# Patient Record
Sex: Male | Born: 1995 | Race: Black or African American | Hispanic: No | Marital: Single | Smoking: Never smoker
Health system: Southern US, Community
[De-identification: ages and names within clinical notes are randomized; demographics above are authoritative.]

## PROBLEM LIST (undated history)

## (undated) DIAGNOSIS — R569 Unspecified convulsions: Secondary | ICD-10-CM

## (undated) DIAGNOSIS — J45909 Unspecified asthma, uncomplicated: Secondary | ICD-10-CM

## (undated) DIAGNOSIS — I10 Essential (primary) hypertension: Secondary | ICD-10-CM

---
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
## 2018-02-13 ENCOUNTER — Encounter (HOSPITAL_COMMUNITY): Payer: Self-pay | Admitting: Emergency Medicine

## 2018-02-13 ENCOUNTER — Emergency Department (HOSPITAL_COMMUNITY): Payer: BLUE CROSS/BLUE SHIELD

## 2018-02-13 ENCOUNTER — Other Ambulatory Visit: Payer: Self-pay

## 2018-02-13 ENCOUNTER — Emergency Department (HOSPITAL_COMMUNITY)
Admission: EM | Admit: 2018-02-13 | Discharge: 2018-02-13 | Disposition: A | Discharge status: Home / Self Care | Payer: BLUE CROSS/BLUE SHIELD | Attending: Emergency Medicine | Admitting: Emergency Medicine

## 2018-02-13 DIAGNOSIS — W228XXA Striking against or struck by other objects, initial encounter: Secondary | ICD-10-CM | POA: Insufficient documentation

## 2018-02-13 DIAGNOSIS — Y929 Unspecified place or not applicable: Secondary | ICD-10-CM | POA: Insufficient documentation

## 2018-02-13 DIAGNOSIS — Z79899 Other long term (current) drug therapy: Secondary | ICD-10-CM | POA: Insufficient documentation

## 2018-02-13 DIAGNOSIS — I10 Essential (primary) hypertension: Secondary | ICD-10-CM | POA: Insufficient documentation

## 2018-02-13 DIAGNOSIS — J45909 Unspecified asthma, uncomplicated: Secondary | ICD-10-CM | POA: Insufficient documentation

## 2018-02-13 DIAGNOSIS — Y990 Civilian activity done for income or pay: Secondary | ICD-10-CM | POA: Insufficient documentation

## 2018-02-13 DIAGNOSIS — M25572 Pain in left ankle and joints of left foot: Secondary | ICD-10-CM | POA: Insufficient documentation

## 2018-02-13 DIAGNOSIS — Y939 Activity, unspecified: Secondary | ICD-10-CM | POA: Insufficient documentation

## 2018-02-13 MED ORDER — IBUPROFEN 400 MG PO TABS
600.0000 mg | ORAL_TABLET | Freq: Once | ORAL | Status: AC
  Administered 2018-02-13: 600 mg via ORAL
  Filled 2018-02-13: qty 1

## 2018-02-13 MED ORDER — IBUPROFEN 600 MG PO TABS: 600.0000 mg | ORAL_TABLET | Freq: Four times a day (QID) | ORAL | 0 refills | Status: AC | PRN

## 2018-02-13 NOTE — ED Triage Notes (Signed)
C/o L ankle pain since 9pm.  States he works at [employer] and kicked boxes that were jammed and foot got caught between boxes.

## 2018-02-13 NOTE — ED Provider Notes (Signed)
[facility] EMERGENCY DEPARTMENT Provider Note   CSN: [id number] Arrival date & time: 02/13/18  0024     History   Chief Complaint Chief Complaint  Patient presents with  . Ankle Pain    HPI Joshua Ingram is a 22 y.o. male a history of seizures, HTN, and asthma who presents to the emergency department with a chief complaint of left ankle pain.  The patient reports that he was at work when he jumped on a belt and his foot became stuck between a pole and a box at approximately 2145.  He states that it took approximately 10 minutes to dislodge his foot from the entrapment.  He endorses sudden onset anterior left ankle pain following the incident.  He characterizes the pain as throbbing, which is been constant since onset.  He has been ambulatory, but with some pain.  He denies numbness, weakness, ecchymosis, left foot or left knee pain.  No history of previous injury or surgery.  No treatment prior to arrival.  The history is provided by the patient. No language interpreter was used.    Past Medical History:  Diagnosis Date  . Asthma   . Hypertension   . Seizures (HCC)     There are no active problems to display for this patient.   History reviewed. No pertinent surgical history.      Home Medications    Prior to Admission medications   Medication Sig Start Date End Date Taking? Authorizing Provider  albuterol (PROVENTIL HFA;VENTOLIN HFA) 108 (90 Base) MCG/ACT inhaler Inhale 1-2 puffs into the lungs every 4 (four) hours as needed for wheezing or shortness of breath. 11/07/15   Sherrilee Gilles, NP  cetirizine (ZYRTEC) 10 MG tablet Take 10 mg by mouth daily.    [provider]  cyclobenzaprine (FLEXERIL) 5 MG tablet Take 1 tablet (5 mg total) by mouth 3 (three) times daily as needed for muscle spasms. 11/07/15   Sherrilee Gilles, NP  Fluticasone-Salmeterol (ADVAIR) 250-50 MCG/DOSE AEPB Inhale 1 puff into the lungs every 12 (twelve) hours.     [provider]  ibuprofen (ADVIL,MOTRIN) 600 MG tablet Take 1 tablet (600 mg total) by mouth every 6 (six) hours as needed (With Food). 02/13/18   Urban Naval A, PA-C  naproxen (NAPROSYN) 500 MG tablet Take 1 tablet (500 mg total) by mouth 2 (two) times daily with a meal. 04/11/16   Oxford, Anselm Pancoast, FNP  Olopatadine HCl (PATADAY) 0.2 % SOLN Place 1 drop into both eyes daily as needed (for allergies).    [provider]  Olopatadine HCl (PATANASE) 0.6 % SOLN Place 1 puff into the nose daily as needed (allergies).    [provider]    Family History No family history on file.  Social History Social History   Tobacco Use  . Smoking status: Never Smoker  . Smokeless tobacco: Never Used  Substance Use Topics  . Alcohol use: Not Currently  . Drug use: Not Currently     Allergies   Naproxen   Review of Systems Review of Systems  Constitutional: Negative for activity change.  Respiratory: Negative for shortness of breath.   Cardiovascular: Negative for chest pain.  Gastrointestinal: Negative for abdominal pain.  Musculoskeletal: Positive for arthralgias, gait problem and myalgias. Negative for back pain and joint swelling.  Skin: Negative for rash.  Neurological: Negative for weakness and numbness.     Physical Exam Updated Vital Signs BP (!) 160/89 (BP Location: Right Arm)  Pulse (!) 107   Temp 98.4 F (36.9 C) (Oral)   Resp 16   SpO2 100%   Physical Exam  Constitutional: He appears well-developed.  HENT:  Head: Normocephalic.  Eyes: Conjunctivae are normal.  Neck: Neck supple.  Cardiovascular: Normal rate and regular rhythm.  No murmur heard. Pulmonary/Chest: Effort normal.  Abdominal: Soft. He exhibits no distension.  Musculoskeletal: He exhibits tenderness. He exhibits no edema or deformity.  Full active and passive range of motion of the left ankle and knee.  He is mildly tender to palpation to the anterior aspect of the left  ankle.  No lateral or medial malleolus tenderness.  No tenderness to the left Achilles tendon.  DP and PT pulses are 2+ and symmetric.  5 out of 5 strength against resistance with dorsiflexion plantarflexion.  Able to bear weight on the bilateral lower extremities.  Ambulatory with minimal difficulty.  Neurological: He is alert.  Skin: Skin is warm and dry.  Psychiatric: His behavior is normal.  Nursing note and vitals reviewed.    ED Treatments / Results  Labs (all labs ordered are listed, but only abnormal results are displayed) Labs Reviewed - No data to display  EKG None  Radiology Dg Ankle Complete Left  Result Date: 02/13/2018 CLINICAL DATA:  22 y/o  M; left ankle pain after injury. EXAM: LEFT ANKLE COMPLETE - 3+ VIEW COMPARISON:  None. FINDINGS: There is no evidence of fracture, dislocation, or joint effusion. There is no evidence of arthropathy or other focal bone abnormality. IMPRESSION: Negative. Electronically Signed   By: Mitzi Hansen M.D.   On: 02/13/2018 01:08    Procedures Procedures (including critical care time)  Medications Ordered in ED Medications  ibuprofen (ADVIL,MOTRIN) tablet 600 mg (has no administration in time range)     Initial Impression / Assessment and Plan / ED Course  I have reviewed the triage vital signs and the nursing notes.  Pertinent labs & imaging results that were available during my care of the patient were reviewed by me and considered in my medical decision making (see chart for details).     Patient X-Ray negative for obvious fracture or dislocation. Pain managed in ED. Pt advised to follow up with primary care if symptoms persist. Patient given brace while in ED, conservative therapy recommended and discussed. Patient will be dc home & is agreeable with above plan.  Final Clinical Impressions(s) / ED Diagnoses   Final diagnoses:  Acute left ankle pain    ED Discharge Orders         Ordered    ibuprofen  (ADVIL,MOTRIN) 600 MG tablet  Every 6 hours PRN     02/13/18 0155           Doria Fern, Pedro Earls A, PA-C 02/13/18 4540    Dione Booze, MD 02/13/18 [phone]

## 2018-02-13 NOTE — Discharge Instructions (Signed)
Thank you for allowing me to care for you today in the Emergency Department.   Take 600 mg of ibuprofen with food every 6 hours or 650 mg of Tylenol every 6 hours for pain control.  Apply an ice pack for 15 to 20 minutes as frequently as needed.  When you are sitting and resting, elevate your leg so that your toes are at or above the level of your nose to help with pain and swelling.  You can start to gently perform exercises of your left ankle as your pain allows.  Wear the brace as needed to provide support for your ankle.  If your symptoms do not start to improve within the next week, call the number on your discharge paperwork to get established with a primary care provider.  Return to the emergency department if you have another fall or injury, if you develop new weakness or numbness in your foot, or if your toes turn blue, or if you have other new, concerning symptoms.
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
## 2022-10-08 ENCOUNTER — Encounter (HOSPITAL_COMMUNITY): Payer: Self-pay | Admitting: *Deleted

## 2022-10-08 ENCOUNTER — Ambulatory Visit (HOSPITAL_COMMUNITY)
Admission: EM | Admit: 2022-10-08 | Discharge: 2022-10-08 | Disposition: A | Discharge status: Home / Self Care | Payer: BC Managed Care – PPO | Attending: Family Medicine | Admitting: Family Medicine

## 2022-10-08 DIAGNOSIS — I10 Essential (primary) hypertension: Secondary | ICD-10-CM

## 2022-10-08 DIAGNOSIS — K219 Gastro-esophageal reflux disease without esophagitis: Secondary | ICD-10-CM | POA: Diagnosis not present

## 2022-10-08 DIAGNOSIS — R0789 Other chest pain: Secondary | ICD-10-CM | POA: Diagnosis not present

## 2022-10-08 MED ORDER — PANTOPRAZOLE SODIUM 20 MG PO TBEC: 20.0000 mg | DELAYED_RELEASE_TABLET | Freq: Every day | ORAL | 2 refills | Status: AC

## 2022-10-08 MED ORDER — MELOXICAM 15 MG PO TABS: 15.0000 mg | ORAL_TABLET | Freq: Every day | ORAL | 0 refills | Status: AC

## 2022-10-08 NOTE — Discharge Instructions (Signed)
Your blood pressure was noted to be elevated during your visit today. If you are currently taking medication for high blood pressure, please ensure you are taking this as directed. If you do not have a history of high blood pressure and your blood pressure remains persistently elevated, you may need to begin taking a medication at some point. You may return here within the next few days to recheck if unable to see your primary care provider or if you do not have a one.  BP (!) 160/102 (BP Location: Right Arm)   Pulse 81   Temp 98.7 F (37.1 C) (Oral)   Resp 18   SpO2 98%   BP Readings from Last 3 Encounters:  10/08/22 (!) 160/102  02/13/18 (!) 145/88  04/16/16 167/88

## 2022-10-08 NOTE — ED Triage Notes (Signed)
Pt states he has had chest pain on and off x 1 week. He states he works for [employer] and he is under a lot of stress. He also states the chest pain is happening more often with he is lifting or moving things. He isnt taking any meds.    He needs a work note.

## 2022-10-11 NOTE — ED Provider Notes (Signed)
[facility]   [id number] 10/08/22 Arrival Time: 1853  ASSESSMENT & PLAN:  1. Chest wall pain   2. Gastroesophageal reflux disease without esophagitis   3. Elevated blood pressure reading in office with diagnosis of hypertension    Patient history and exam consistent with non-cardiac cause of chest pain. Reproducible to chest wall palpation.  Begin: Meds ordered this encounter  Medications   meloxicam (MOBIC) 15 MG tablet    Sig: Take 1 tablet (15 mg total) by mouth daily.    Dispense:  7 tablet    Refill:  0   pantoprazole (PROTONIX) 20 MG tablet    Sig: Take 1 tablet (20 mg total) by mouth daily.    Dispense:  30 tablet    Refill:  2     Discharge Instructions      Your blood pressure was noted to be elevated during your visit today. If you are currently taking medication for high blood pressure, please ensure you are taking this as directed. If you do not have a history of high blood pressure and your blood pressure remains persistently elevated, you may need to begin taking a medication at some point. You may return here within the next few days to recheck if unable to see your primary care provider or if you do not have a one.  BP (!) 160/102 (BP Location: Right Arm)   Pulse 81   Temp 98.7 F (37.1 C) (Oral)   Resp 18   SpO2 98%   BP Readings from Last 3 Encounters:  10/08/22 (!) 160/102  02/13/18 (!) 145/88  04/16/16 167/88          Chest pain precautions given. Reviewed expectations re: course of current medical issues. Questions answered. Outlined signs and symptoms indicating need for more acute intervention. Patient verbalized understanding. After Visit Summary given.   SUBJECTIVE:  History from: patient. Joshua Ingram is a 27 y.o. male who presents with complaint of Pt states he has had chest pain on and off x 1 week. He states he works for [employer] and he is under a lot of stress. He also states the chest pain is happening more often with  he is lifting or moving things. He isnt taking any meds.  Belching more recently; notices epigastric discomfort after larger meals. No tx PTA>  He needs a work note.   Social History   Tobacco Use  Smoking Status Never  Smokeless Tobacco Never   Social History   Substance and Sexual Activity  Alcohol Use Not Currently    OBJECTIVE:  Vitals:   10/08/22 1919  BP: (!) 160/102  Pulse: 81  Resp: 18  Temp: 98.7 F (37.1 C)  TempSrc: Oral  SpO2: 98%    General appearance: alert, oriented, no acute distress Eyes: PERRLA; EOMI; conjunctivae normal HENT: normocephalic; atraumatic Neck: supple with FROM Lungs: without labored respirations; speaks full sentences without difficulty; CTAB Heart: regular rate and rhythm without murmer Chest Wall: with tenderness to palpation over midline to LEFT chest (reports this reproduces pain described in history) Abdomen: soft, non-tender; no guarding or rebound tenderness Extremities: without edema; without calf swelling or tenderness; symmetrical without gross deformities Skin: warm and dry; without rash or lesions Neuro: normal gait Psychological: alert and cooperative; normal mood and affect  Labs: Results for orders placed or performed during the hospital encounter of 10/03/07  Rapid strep screen  Result Value Ref Range   Streptococcus, Group A Screen (Direct) NEGATIVE   Influenza A+B  Virus Ag-Direct (Rapid)  Result Value Ref Range   Source-INFBD NASAL WASHINGS    Inflenza A Ag (A)     POSITIVE CRITICAL RESULT CALLED TO, READ BACK BY AND VERIFIED WITH: BUSH R.,EMT 10/03/07 1404 BY DUNCANJ   Influenza B Ag NEGATIVE   Urinalysis, Routine w reflex microscopic  Result Value Ref Range   Color, Urine YELLOW    APPearance CLEAR    Specific Gravity, Urine 1.043 (H)    pH 6.5    Glucose, UA NEGATIVE    Hgb urine dipstick NEGATIVE    Bilirubin Urine NEGATIVE    Ketones, ur NEGATIVE    Protein, ur NEGATIVE    Urobilinogen, UA 0.2     Nitrite NEGATIVE    Leukocytes, UA      NEGATIVE MICROSCOPIC NOT DONE ON URINES WITH NEGATIVE PROTEIN, BLOOD, LEUKOCYTES, NITRITE, OR GLUCOSE <1000 mg/dL.   Labs Reviewed - No data to display  Imaging: No results found.   Allergies  Allergen Reactions   Naproxen Nausea Only    Past Medical History:  Diagnosis Date   Asthma    Hypertension    Seizures (HCC)    Social History   Socioeconomic History   Marital status: Single    Spouse name: Not on file   Number of children: Not on file   Years of education: Not on file   Highest education level: Not on file  Occupational History   Not on file  Tobacco Use   Smoking status: Never   Smokeless tobacco: Never  Vaping Use   Vaping Use: Never used  Substance and Sexual Activity   Alcohol use: Not Currently   Drug use: Not Currently   Sexual activity: Not on file  Other Topics Concern   Not on file  Social History Narrative   Not on file   Social Determinants of Health   Financial Resource Strain: Not on file  Food Insecurity: Not on file  Transportation Needs: Not on file  Physical Activity: Not on file  Stress: Not on file  Social Connections: Not on file  Intimate Partner Violence: Not on file   History reviewed. No pertinent family history. History reviewed. No pertinent surgical history.    Mardella Layman, MD 10/11/22 [phone]

## 2023-05-22 ENCOUNTER — Other Ambulatory Visit: Payer: Self-pay | Admitting: Family Medicine

## 2023-05-22 ENCOUNTER — Ambulatory Visit: Payer: Self-pay

## 2023-05-22 DIAGNOSIS — M545 Low back pain, unspecified: Secondary | ICD-10-CM

## 2024-01-18 ENCOUNTER — Ambulatory Visit
Admission: EM | Admit: 2024-01-18 | Discharge: 2024-01-18 | Disposition: A | Discharge status: Home / Self Care | Attending: Nurse Practitioner | Admitting: Nurse Practitioner

## 2024-01-18 ENCOUNTER — Encounter: Payer: Self-pay | Admitting: Emergency Medicine

## 2024-01-18 ENCOUNTER — Encounter (INDEPENDENT_AMBULATORY_CARE_PROVIDER_SITE_OTHER): Payer: Self-pay | Admitting: Otolaryngology

## 2024-01-18 ENCOUNTER — Ambulatory Visit (INDEPENDENT_AMBULATORY_CARE_PROVIDER_SITE_OTHER): Admitting: Otolaryngology

## 2024-01-18 VITALS — BP 137/77 | HR 92

## 2024-01-18 DIAGNOSIS — T162XXA Foreign body in left ear, initial encounter: Secondary | ICD-10-CM | POA: Diagnosis not present

## 2024-01-18 MED ORDER — OFLOXACIN 0.3 % OT SOLN: 5.0000 [drp] | Freq: Two times a day (BID) | OTIC | 0 refills | Status: AC

## 2024-01-18 NOTE — ED Provider Notes (Signed)
 [name]    CSN: [id number] Arrival date & time: 01/18/24  0810      History   Chief Complaint Chief Complaint  Patient presents with   Foreign Body in Ear    HPI Joshua Ingram is a 28 y.o. male.   Patient presents with foreign body in his left ear canal.  Patient reports that he wants to clean his ear with a Q-tip last night.  He states broke off.  The cotton part remained in the ear.  He attempted to remove using tweezers but was unsuccessful.  He reports that his hearing is muffled in the left ear but denies any pain, dizziness or headache.  The following sections of the patient's history were reviewed and updated as appropriate: allergies, current medications, past family history, past medical history, past social history, past surgical history, and problem list.      Past Medical History:  Diagnosis Date   Asthma    Hypertension    Seizures (HCC)     There are no active problems to display for this patient.   History reviewed. No pertinent surgical history.     Home Medications    Prior to Admission medications   Medication Sig Start Date End Date Taking? Authorizing Provider  Olopatadine HCl (PATADAY) 0.2 % SOLN Place 1 drop into both eyes daily as needed (for allergies).   Yes [provider]  albuterol  (PROVENTIL  HFA;VENTOLIN  HFA) 108 (90 Base) MCG/ACT inhaler Inhale 1-2 puffs into the lungs every 4 (four) hours as needed for wheezing or shortness of breath. 11/07/15   Everlean Laymon SAILOR, NP  cetirizine (ZYRTEC) 10 MG tablet Take 10 mg by mouth daily. Patient not taking: Reported on 01/18/2024    [provider]  cyclobenzaprine  (FLEXERIL ) 5 MG tablet Take 1 tablet (5 mg total) by mouth 3 (three) times daily as needed for muscle spasms. Patient not taking: Reported on 01/18/2024 11/07/15   Everlean Laymon SAILOR, NP  Fluticasone-Salmeterol (ADVAIR) 250-50 MCG/DOSE AEPB Inhale 1 puff into the lungs every 12 (twelve)  hours. Patient not taking: Reported on 01/18/2024    [provider]  ibuprofen  (ADVIL ,MOTRIN ) 600 MG tablet Take 1 tablet (600 mg total) by mouth every 6 (six) hours as needed (With Food). 02/13/18   McDonald, Mia A, PA-C  meloxicam  (MOBIC ) 15 MG tablet Take 1 tablet (15 mg total) by mouth daily. Patient not taking: Reported on 01/18/2024 10/08/22   Rolinda Rogue, MD  Olopatadine HCl (PATANASE) 0.6 % SOLN Place 1 puff into the nose daily as needed (allergies).    [provider]  pantoprazole  (PROTONIX ) 20 MG tablet Take 1 tablet (20 mg total) by mouth daily. Patient not taking: Reported on 01/18/2024 10/08/22   Rolinda Rogue, MD    Family History History reviewed. No pertinent family history.  Social History Social History   Tobacco Use   Smoking status: Never    Passive exposure: Never   Smokeless tobacco: Never  Vaping Use   Vaping status: Never Used  Substance Use Topics   Alcohol use: Not Currently   Drug use: Not Currently     Allergies   Naproxen    Review of Systems Review of Systems  HENT:  Negative for ear pain.        Q-tip stuck in left ear  Neurological:  Negative for dizziness and headaches.  All other systems reviewed and are negative.    Physical Exam Triage Vital Signs ED Triage Vitals  Encounter Vitals Group  BP 01/18/24 0947 (!) 152/101     Girls Systolic BP Percentile --      Girls Diastolic BP Percentile --      Boys Systolic BP Percentile --      Boys Diastolic BP Percentile --      Pulse Rate 01/18/24 0947 91     Resp 01/18/24 0947 18     Temp 01/18/24 0947 98.2 F (36.8 C)     Temp Source 01/18/24 0947 Oral     SpO2 01/18/24 0947 98 %     Weight --      Height --      Head Circumference --      Peak Flow --      Pain Score 01/18/24 0948 0     Pain Loc --      Pain Education --      Exclude from Growth Chart --    No data found.  Updated Vital Signs BP (!) 152/101 (BP Location: Left Arm)   Pulse 91   Temp  98.2 F (36.8 C) (Oral)   Resp 18   SpO2 98%   Visual Acuity Right Eye Distance:   Left Eye Distance:   Bilateral Distance:    Right Eye Near:   Left Eye Near:    Bilateral Near:     Physical Exam Vitals reviewed.  Constitutional:      General: He is awake. He is not in acute distress.    Appearance: Normal appearance. He is well-developed. He is not ill-appearing, toxic-appearing or diaphoretic.  HENT:     Head: Normocephalic.     Right Ear: Hearing normal.     Left Ear: Hearing and external ear normal. A foreign body is present.     Ears:     Comments: A white foreign body is visualized within the left external auditory canal, unable to visualize TM due to presence of FB     Nose: Nose normal.     Mouth/Throat:     Mouth: Mucous membranes are moist.  Eyes:     General: Vision grossly intact.     Conjunctiva/sclera: Conjunctivae normal.  Cardiovascular:     Rate and Rhythm: Normal rate and regular rhythm.     Heart sounds: Normal heart sounds.  Pulmonary:     Effort: Pulmonary effort is normal.     Breath sounds: Normal breath sounds and air entry.  Musculoskeletal:        General: Normal range of motion.     Cervical back: Normal range of motion and neck supple.  Skin:    General: Skin is warm and dry.  Neurological:     General: No focal deficit present.     Mental Status: He is alert and oriented to person, place, and time.  Psychiatric:        Speech: Speech normal.        Behavior: Behavior is cooperative.      UC Treatments / Results  Labs (all labs ordered are listed, but only abnormal results are displayed) Labs Reviewed - No data to display  EKG   Radiology No results found.  Procedures Foreign Body Removal  Date/Time: 01/18/2024 11:46 AM  Performed by: Iola Lukes, FNP Authorized by: Iola Lukes, FNP   Consent:    Consent obtained:  Verbal   Consent given by:  Patient   Risks, benefits, and alternatives were discussed: yes      Risks discussed:  Bleeding, infection, pain, worsening of condition and  incomplete removal Universal protocol:    Patient identity confirmed:  Verbally with patient Location:    Location:  Ear   Ear location:  L ear Anesthesia:    Anesthesia method:  None Procedure type:    Procedure complexity:  Complex Procedure details:    Localization method:  Visualized   Removal mechanism:  Hemostat and irrigation   Foreign bodies recovered:  None Post-procedure details:    Procedure completion:  Procedure terminated electively by provider (unable to remove FB despite multiple attempts and methods) Comments:     Patient tolerated FB removal attempts well, no immediate complications noted   (including critical care time)  Medications Ordered in UC Medications - No data to display  Initial Impression / Assessment and Plan / UC Course  I have reviewed the triage vital signs and the nursing notes.  Pertinent labs & imaging results that were available during my care of the patient were reviewed by me and considered in my medical decision making (see chart for details).     Patient presents with a foreign body in the left ear canal, reported to be from a Q-tip. On exam, a white foreign body was visualized within the canal. Multiple attempts were made to remove the object using standard techniques; however, removal was unsuccessful. Given the persistence of the foreign body and risk of canal or tympanic membrane trauma, ENT consultation was obtained. Discussed case with Dr. Okey, ENT on call, who agreed to evaluate the patient in her office this afternoon. Patient was instructed to arrive between 1:00-3:00 PM today for further management. Return precautions were reviewed, including worsening pain, drainage, bleeding, fever, or hearing loss.  Today's evaluation has revealed no signs of a dangerous process. Discussed diagnosis with patient and/or guardian. Patient and/or guardian aware of their  diagnosis, possible red flag symptoms to watch out for and need for close follow up. Patient and/or guardian understands verbal and written discharge instructions. Patient and/or guardian comfortable with plan and disposition.  Patient and/or guardian has a clear mental status at this time, good insight into illness (after discussion and teaching) and has clear judgment to make decisions regarding their care  Documentation was completed with the aid of voice recognition software. Transcription may contain typographical errors.  Final Clinical Impressions(s) / UC Diagnoses   Final diagnoses:  Ear foreign body, left, initial encounter     Discharge Instructions      You were seen today for a piece of a Q-tip stuck in your left ear. The object was visible on exam but could not be safely removed in the office after several attempts. Because of the location and risk of injury to the ear canal or eardrum, you have been referred to an ear, nose, and throat (ENT) specialist for removal.  You are scheduled to be seen by Dr. Okey, ENT, in her office this afternoon between 1:00 and 3:00 PM.   Until then, do not place anything else in your ear, including Q-tips, drops, or other objects.   Keep the ear dry and avoid scratching or pressing on it.  If you notice sudden pain, bleeding, pus or foul-smelling drainage, fever, or a sudden decrease in hearing before your appointment, go to the emergency department right away.     ED Prescriptions   None    PDMP not reviewed this encounter.   Iola Lukes, [place] 01/18/24 1148

## 2024-01-18 NOTE — ED Triage Notes (Addendum)
 Pt reports end of qtip broke off in his L ear last night and he was unable to get it out. Denies pain. Tried to get it out with tweezers on his own. Ear canal is inflamed, but cotton qtip is visible further down.

## 2024-01-18 NOTE — Progress Notes (Signed)
 ENT CONSULT:  Reason for Consult: left ear foreign body   HPI: Discussed the use of AI scribe software for clinical note transcription with the patient, who gave verbal consent to proceed.  History of Present Illness Joshua Ingram is a 28 year old male who presents with ear discomfort after using a Q-tip with a piece of the cotton becoming lodged.  He experienced ear itching last night, which led him to use a Q-tip for relief. During this process, he felt a sense of relief.  He denies any pain associated with the incident and has no history of ear surgeries or ear infections.     Records Reviewed:  UC visit today note by NP Lucie Sarin  Patient presents with foreign body in his left ear canal.  Patient reports that he wants to clean his ear with a Q-tip last night.  He states broke off.  The cotton part remained in the ear.  He attempted to remove using tweezers but was unsuccessful.  He reports that his hearing is muffled in the left ear but denies any pain, dizziness or headache.   The following sections of the patient's history were reviewed and updated as appropriate: allergies, current medications, past family history, past medical history, past social history, past surgical history, and problem list.    Past Medical History:  Diagnosis Date   Asthma    Hypertension    Seizures (HCC)     History reviewed. No pertinent surgical history.  History reviewed. No pertinent family history.  Social History:  reports that he has never smoked. He has never been exposed to tobacco smoke. He has never used smokeless tobacco. He reports that he does not currently use alcohol. He reports that he does not currently use drugs.  Allergies:  Allergies  Allergen Reactions   Naproxen  Nausea Only    Medications: I have reviewed the patient's current medications.  The PMH, PSH, Medications, Allergies, and SH were reviewed and updated.  ROS: Constitutional: Negative for fever,  weight loss and weight gain. Cardiovascular: Negative for chest pain and dyspnea on exertion. Respiratory: Is not experiencing shortness of breath at rest. Gastrointestinal: Negative for nausea and vomiting. Neurological: Negative for headaches. Psychiatric: The patient is not nervous/anxious  Blood pressure 137/77, pulse 92, SpO2 96%.  PHYSICAL EXAM:  Exam: General: Well-developed, well-nourished Respiratory Respiratory effort: Equal inspiration and expiration without stridor Cardiovascular Peripheral Vascular: Warm extremities with equal color/perfusion Eyes: No nystagmus with equal extraocular motion bilaterally Neuro/Psych/Balance: Patient oriented to person, place, and time; Appropriate mood and affect; Gait is intact with no imbalance; Cranial nerves I-XII are intact Head and Face Inspection: Normocephalic and atraumatic without mass or lesion Palpation: Facial skeleton intact without bony stepoffs Salivary Glands: No mass or tenderness Facial Strength: Facial motility symmetric and full bilaterally ENT Pinna: External ear intact and fully developed External canal: Canal is patent with intact skin, cotton tip in left EAC, removed see procedure note Tympanic Membrane: Clear and mobile External Nose: No scar or anatomic deformity Internal Nose: Septum intact and midline. No edema, polyp, or rhinorrhea Lips, Teeth, and gums: Mucosa and teeth intact and viable TMJ: No pain to palpation with full mobility Oral cavity/oropharynx: No erythema or exudate, no lesions present Neck Neck and Trachea: Midline trachea without mass or lesion Thyroid: No mass or nodularity Lymphatics: No lymphadenopathy  Procedure: Procedure:left ear foreign body removal   Diagnosis: left ear foreign body removal   Informed consent: Timeout performed and informed consent was obtained.  Procedure: Operating microscope was employed to evaluate the ear(s) and remove left ear canal foreign body.  An  ear speculum and an alligator forceps were used.   Findings: Normal appearing tympanic membranes without perforations, and external canals are normal after removal of left ear cotton tip. No middle ear fluid bilaterally.   Complications: None. Patient tolerated well.   Assessment/Plan: Encounter Diagnosis  Name Primary?   Ear foreign body, left, initial encounter Yes    Assessment and Plan Assessment & Plan Left ear foreign body, removed. TM intact with minor irritation in left EAC - Prescribed Floxin  drops twice daily for five days.    Thank you for allowing me to participate in the care of this patient. Please do not hesitate to contact me with any questions or concerns.   Elena Larry, MD Otolaryngology [facility] Phone: [phone] Fax: [phone]    01/18/2024, 1:16 PM

## 2024-01-18 NOTE — Discharge Instructions (Addendum)
 You were seen today for a piece of a Q-tip stuck in your left ear. The object was visible on exam but could not be safely removed in the office after several attempts. Because of the location and risk of injury to the ear canal or eardrum, you have been referred to an ear, nose, and throat (ENT) specialist for removal.  You are scheduled to be seen by Dr. Okey, ENT, in her office this afternoon between 1:00 and 3:00 PM.   Until then, do not place anything else in your ear, including Q-tips, drops, or other objects.   Keep the ear dry and avoid scratching or pressing on it.  If you notice sudden pain, bleeding, pus or foul-smelling drainage, fever, or a sudden decrease in hearing before your appointment, go to the emergency department right away.
